# Patient Record
Sex: Female | Born: 1980 | Race: White | Hispanic: No
Health system: Southern US, Community
[De-identification: ages and names within clinical notes are randomized; demographics above are authoritative.]

---

## 2013-09-29 ENCOUNTER — Emergency Department (HOSPITAL_COMMUNITY): Payer: Medicaid - Out of State

## 2013-09-29 ENCOUNTER — Emergency Department (HOSPITAL_COMMUNITY)
Admission: EM | Admit: 2013-09-29 | Discharge: 2013-09-30 | Disposition: A | Discharge status: Home / Self Care | Payer: Medicaid - Out of State | Attending: Emergency Medicine | Admitting: Emergency Medicine

## 2013-09-29 DIAGNOSIS — R110 Nausea: Secondary | ICD-10-CM | POA: Insufficient documentation

## 2013-09-29 DIAGNOSIS — R1084 Generalized abdominal pain: Secondary | ICD-10-CM | POA: Insufficient documentation

## 2013-09-29 DIAGNOSIS — K5900 Constipation, unspecified: Secondary | ICD-10-CM | POA: Insufficient documentation

## 2013-09-29 DIAGNOSIS — Z3202 Encounter for pregnancy test, result negative: Secondary | ICD-10-CM | POA: Insufficient documentation

## 2013-09-29 DIAGNOSIS — Z79899 Other long term (current) drug therapy: Secondary | ICD-10-CM | POA: Insufficient documentation

## 2013-09-29 LAB — URINALYSIS, ROUTINE W REFLEX MICROSCOPIC
BILIRUBIN URINE: NEGATIVE
Glucose, UA: NEGATIVE mg/dL
Hgb urine dipstick: NEGATIVE
KETONES UR: NEGATIVE mg/dL
Leukocytes, UA: NEGATIVE
NITRITE: NEGATIVE
Protein, ur: NEGATIVE mg/dL
Specific Gravity, Urine: 1.01 (ref 1.005–1.030)
Urobilinogen, UA: 0.2 mg/dL (ref 0.0–1.0)
pH: 8 (ref 5.0–8.0)

## 2013-09-29 LAB — CBC WITH DIFFERENTIAL/PLATELET
Basophils Absolute: 0 10*3/uL (ref 0.0–0.1)
Basophils Relative: 0 % (ref 0–1)
Eosinophils Absolute: 0.1 10*3/uL (ref 0.0–0.7)
Eosinophils Relative: 1 % (ref 0–5)
HCT: 35.4 % — ABNORMAL LOW (ref 36.0–46.0)
HEMOGLOBIN: 11.6 g/dL — AB (ref 12.0–15.0)
LYMPHS ABS: 3.2 10*3/uL (ref 0.7–4.0)
Lymphocytes Relative: 19 % (ref 12–46)
MCH: 30.5 pg (ref 26.0–34.0)
MCHC: 32.8 g/dL (ref 30.0–36.0)
MCV: 93.2 fL (ref 78.0–100.0)
Monocytes Absolute: 1.3 10*3/uL — ABNORMAL HIGH (ref 0.1–1.0)
Monocytes Relative: 8 % (ref 3–12)
NEUTROS ABS: 12.3 10*3/uL — AB (ref 1.7–7.7)
NEUTROS PCT: 72 % (ref 43–77)
PLATELETS: 331 10*3/uL (ref 150–400)
RBC: 3.8 MIL/uL — AB (ref 3.87–5.11)
RDW: 13.4 % (ref 11.5–15.5)
WBC: 16.9 10*3/uL — ABNORMAL HIGH (ref 4.0–10.5)

## 2013-09-29 LAB — COMPREHENSIVE METABOLIC PANEL
ALBUMIN: 3.2 g/dL — AB (ref 3.5–5.2)
ALT: 13 U/L (ref 0–35)
AST: 16 U/L (ref 0–37)
Alkaline Phosphatase: 53 U/L (ref 39–117)
BUN: 14 mg/dL (ref 6–23)
CHLORIDE: 108 meq/L (ref 96–112)
CO2: 18 mEq/L — ABNORMAL LOW (ref 19–32)
Calcium: 8.4 mg/dL (ref 8.4–10.5)
Creatinine, Ser: 0.7 mg/dL (ref 0.50–1.10)
GFR calc Af Amer: >90 mL/min (ref >90)
GFR calc non Af Amer: >90 mL/min (ref >90)
GLUCOSE: 117 mg/dL — AB (ref 70–99)
POTASSIUM: 3.5 meq/L — AB (ref 3.7–5.3)
SODIUM: 140 meq/L (ref 137–147)
Total Protein: 6.6 g/dL (ref 6.0–8.3)

## 2013-09-29 LAB — RAPID URINE DRUG SCREEN, HOSP PERFORMED
Amphetamines: NOT DETECTED
BARBITURATES: NOT DETECTED
BENZODIAZEPINES: NOT DETECTED
Cocaine: NOT DETECTED
OPIATES: POSITIVE — AB
TETRAHYDROCANNABINOL: NOT DETECTED

## 2013-09-29 LAB — LIPASE, BLOOD: LIPASE: 51 U/L (ref 11–59)

## 2013-09-29 LAB — POC URINE PREG, ED: Preg Test, Ur: NEGATIVE

## 2013-09-29 MED ORDER — POLYETHYLENE GLYCOL 3350 17 G PO PACK: 17.0000 g | PACK | Freq: Every day | ORAL | Status: AC

## 2013-09-29 MED ORDER — MORPHINE SULFATE 4 MG/ML IJ SOLN
4.0000 mg | Freq: Once | INTRAMUSCULAR | Status: AC
  Administered 2013-09-29: 4 mg via INTRAVENOUS
  Filled 2013-09-29: qty 1

## 2013-09-29 MED ORDER — ONDANSETRON HCL 4 MG/2ML IJ SOLN
4.0000 mg | Freq: Once | INTRAMUSCULAR | Status: AC
  Administered 2013-09-29: 4 mg via INTRAVENOUS
  Filled 2013-09-29: qty 2

## 2013-09-29 MED ORDER — IOHEXOL 300 MG/ML  SOLN
100.0000 mL | Freq: Once | INTRAMUSCULAR | Status: AC | PRN
  Administered 2013-09-29: 100 mL via INTRAVENOUS

## 2013-09-29 MED ORDER — IOHEXOL 300 MG/ML  SOLN
50.0000 mL | Freq: Once | INTRAMUSCULAR | Status: AC | PRN
  Administered 2013-09-29: 50 mL via ORAL

## 2013-09-29 NOTE — ED Notes (Signed)
Bed: WA07 Expected date:  Expected time:  Means of arrival:  Comments: EMS/abd. pain 

## 2013-09-29 NOTE — Discharge Instructions (Signed)
1. Medications: miralax, usual home medications 2. Treatment: rest, drink plenty of fluids,  3. Follow Up: Please followup with your primary doctor for discussion of your diagnoses and further evaluation after today's visit;   Constipation, Adult Constipation is when a person has fewer than 3 bowel movements a week; has difficulty having a bowel movement; or has stools that are dry, hard, or larger than normal. As people grow older, constipation is more common. If you try to fix constipation with medicines that make you have a bowel movement (laxatives), the problem may get worse. Long-term laxative use may cause the muscles of the colon to become weak. A low-fiber diet, not taking in enough fluids, and taking certain medicines may make constipation worse. CAUSES   Certain medicines, such as antidepressants, pain medicine, iron supplements, antacids, and water pills.   Certain diseases, such as diabetes, irritable bowel syndrome (IBS), thyroid disease, or depression.   Not drinking enough water.   Not eating enough fiber-rich foods.   Stress or travel.  Lack of physical activity or exercise.  Not going to the restroom when there is the urge to have a bowel movement.  Ignoring the urge to have a bowel movement.  Using laxatives too much. SYMPTOMS   Having fewer than 3 bowel movements a week.   Straining to have a bowel movement.   Having hard, dry, or larger than normal stools.   Feeling full or bloated.   Pain in the lower abdomen.  Not feeling relief after having a bowel movement. DIAGNOSIS  Your caregiver will take a medical history and perform a physical exam. Further testing may be done for severe constipation. Some tests may include:   A barium enema X-ray to examine your rectum, colon, and sometimes, your small intestine.  A sigmoidoscopy to examine your lower colon.  A colonoscopy to examine your entire colon. TREATMENT  Treatment will depend on the  severity of your constipation and what is causing it. Some dietary treatments include drinking more fluids and eating more fiber-rich foods. Lifestyle treatments may include regular exercise. If these diet and lifestyle recommendations do not help, your caregiver may recommend taking over-the-counter laxative medicines to help you have bowel movements. Prescription medicines may be prescribed if over-the-counter medicines do not work.  HOME CARE INSTRUCTIONS   Increase dietary fiber in your diet, such as fruits, vegetables, whole grains, and beans. Limit high-fat and processed sugars in your diet, such as [place] fries, hamburgers, cookies, candies, and soda.   A fiber supplement may be added to your diet if you cannot get enough fiber from foods.   Drink enough fluids to keep your urine clear or pale yellow.   Exercise regularly or as directed by your caregiver.   Go to the restroom when you have the urge to go. Do not hold it.  Only take medicines as directed by your caregiver. Do not take other medicines for constipation without talking to your caregiver first. SEEK IMMEDIATE MEDICAL CARE IF:   You have bright red blood in your stool.   Your constipation lasts for more than 4 days or gets worse.   You have abdominal or rectal pain.   You have thin, pencil-like stools.  You have unexplained weight loss. MAKE SURE YOU:   Understand these instructions.  Will watch your condition.  Will get help right away if you are not doing well or get worse. Document Released: 01/02/2004 Document Revised: 06/28/2011 Document Reviewed: 01/15/2013 [facility] Patient Information 2014 [employer], [place].

## 2013-09-29 NOTE — ED Notes (Signed)
Patient was driving [place] and began to have sudden onset LUQ and RUQ pain that's 4/10 cramping feeling. Patient is traveling to [place]. Patient was given 50 fent on way to ER.

## 2013-09-29 NOTE — ED Provider Notes (Signed)
CSN: [id number]     Arrival date & time 09/29/13  1816 History   First MD Initiated Contact with Patient 09/29/13 1821     Chief Complaint  Patient presents with  . Abdominal Pain     (Consider location/radiation/quality/duration/timing/severity/associated sxs/prior Treatment) The history is provided by the patient and medical records. No language interpreter was used.    Julia HeimlichStephanie Ramirez is a 33 y.o. female  with no known medical Hx presents to the Emergency Department complaining of acute persistent LUQ and RUQ abd pain onset 5pm, rated at a 4/10 and described and cramping.  Associated symptoms include nausea without vomiting.  Pt reports [place].  Pt reports no similar symptoms in the past.  NO aggravating factors; Fentanyl given by EMS decreased the pain.  Pt denies fever, chills, headache, neck pain chest pain, SOB, vomiting, diarrhea, weakness, dizziness, syncope, dysuria, hematuria.  Pt reports 1 loose stool this AM, but no diarrhea.  NO melena or hematochezia.  Patient reports somewhat regular bowel movements that are small.    No past medical history on file. No past surgical history on file. No family history on file. History  Substance Use Topics  . Smoking status: Not on file  . Smokeless tobacco: Not on file  . Alcohol Use: Not on file   OB History   No data available     Review of Systems  Constitutional: Negative for fever, diaphoresis, appetite change, fatigue and unexpected weight change.  HENT: Negative for mouth sores and trouble swallowing.   Respiratory: Negative for cough, chest tightness, shortness of breath, wheezing and stridor.   Cardiovascular: Negative for chest pain and palpitations.  Gastrointestinal: Positive for nausea and abdominal pain. Negative for vomiting, diarrhea, constipation, blood in stool, abdominal distention and rectal pain.  Genitourinary: Negative for dysuria,  urgency, frequency, hematuria, flank pain and difficulty urinating.  Musculoskeletal: Negative for back pain, neck pain and neck stiffness.  Skin: Negative for rash.  Neurological: Negative for weakness.  Hematological: Negative for adenopathy.  Psychiatric/Behavioral: Negative for confusion.  All other systems reviewed and are negative.     Allergies  Sulfa antibiotics  Home Medications   Prior to Admission medications   Medication Sig Start Date End Date Taking? Authorizing Provider  diazepam (VALIUM) 5 MG tablet Take 5 mg by mouth every 6 (six) hours as needed for anxiety.   Yes Historical Provider, MD  levonorgestrel-ethinyl estradiol (ORSYTHIA) 0.1-20 MG-MCG tablet Take 1 tablet by mouth daily.   Yes Historical Provider, MD  Multiple Vitamin (MULTIVITAMIN WITH MINERALS) TABS tablet Take 1 tablet by mouth daily.   Yes Historical Provider, MD  topiramate (TOPAMAX) 50 MG tablet Take 75 mg by mouth 2 (two) times daily.   Yes Historical Provider, MD  polyethylene glycol (MIRALAX / GLYCOLAX) packet Take 17 g by mouth daily. 09/29/13   Kahlin Mark, PA-C   BP 124/74  Pulse 62  Temp(Src) 99 F (37.2 C) (Oral)  Resp 19  SpO2 100%  LMP 09/24/2013 Physical Exam  Nursing note and vitals reviewed. Constitutional: She is oriented to person, place, and time. She appears well-developed and well-nourished. No distress.  Awake, alert, nontoxic appearance  HENT:  Head: Normocephalic and atraumatic.  Nose: Nose normal.  Mouth/Throat: Oropharynx is clear and moist. No oropharyngeal exudate.  Eyes: Conjunctivae are normal. No scleral icterus.  Neck: Normal range of motion. Neck supple.  Cardiovascular: Normal rate, regular  rhythm, normal heart sounds and intact distal pulses.   Pulmonary/Chest: Effort normal and breath sounds normal. No respiratory distress. She has no wheezes.  Abdominal: Soft. Bowel sounds are normal. She exhibits no distension and no mass. There is no tenderness.  There is no rigidity, no rebound, no guarding and no CVA tenderness.  Abdomen is soft and nontender No rigidity, guarding or peritoneal signs No CVA tenderness  Musculoskeletal: Normal range of motion. She exhibits no edema and no tenderness.  Neurological: She is alert and oriented to person, place, and time. She exhibits normal muscle tone. Coordination normal.  Speech is clear and goal oriented Moves extremities without ataxia  Skin: Skin is warm and dry. She is not diaphoretic. No erythema.  Psychiatric: She has a normal mood and affect.    ED Course  Procedures (including critical care time) Labs Review Labs Reviewed  CBC WITH DIFFERENTIAL - Abnormal; Notable for the following:    WBC 16.9 (*)    RBC 3.80 (*)    Hemoglobin 11.6 (*)    HCT 35.4 (*)    Neutro Abs 12.3 (*)    Monocytes Absolute 1.3 (*)    All other components within normal limits  COMPREHENSIVE METABOLIC PANEL - Abnormal; Notable for the following:    Potassium 3.5 (*)    CO2 18 (*)    Glucose, Bld 117 (*)    Albumin 3.2 (*)    Total Bilirubin <0.2 (*)    All other components within normal limits  URINALYSIS, ROUTINE W REFLEX MICROSCOPIC - Abnormal; Notable for the following:    APPearance CLOUDY (*)    All other components within normal limits  URINE RAPID DRUG SCREEN (HOSP PERFORMED) - Abnormal; Notable for the following:    Opiates POSITIVE (*)    All other components within normal limits  LIPASE, BLOOD  POC URINE PREG, ED    Imaging Review Ct Abdomen Pelvis W Contrast  09/29/2013   CLINICAL DATA:  Upper abdominal pain, cramping.  EXAM: CT ABDOMEN AND PELVIS WITH CONTRAST  TECHNIQUE: Multidetector CT imaging of the abdomen and pelvis was performed using the standard protocol following bolus administration of intravenous contrast.  CONTRAST:  50mL OMNIPAQUE IOHEXOL 300 MG/ML SOLN, 100mL OMNIPAQUE IOHEXOL 300 MG/ML SOLN  COMPARISON:  None.  FINDINGS: Lung bases are clear.  No effusions.  Heart is normal  size.  Liver, gallbladder, spleen, pancreas, adrenals and kidneys are normal. Uterus, adnexae and urinary bladder are unremarkable. Trace free fluid in the pelvis, likely physiologic.  Appendix is visualized and is normal. Moderate stool burden throughout the colon. Stomach, large and small bowel otherwise unremarkable.  IMPRESSION: Moderate stool burden in the colon. Normal appendix. No acute findings in the abdomen or pelvis.   Electronically Signed   By: Charlett NoseKevin  Dover M.D.   On: 09/29/2013 21:30     EKG Interpretation None      MDM   Final diagnoses:  Abdominal pain, generalized  Constipation   Julia HeimlichStephanie Mason presents with generalized abdominal pain of sudden onset several hours prior to arrival. Abdomen soft nontender on exam. Will obtain labs, give fluid and pain control.  8:35PM Patient with leukocytosis of 16.9. We'll obtain CT scan.  Patient pain improving.  Reports that she simply feels pressure in her abdomen now  10:30PM CT with moderate stool burden but normal 10 next gallbladder and liver. No acute findings on exam.  Pregnancy test negative and no evidence of urinary tract infection.  Patient with evidence of constipation  on CT abdomen.   Patient will be discharged home with MiraLax and instructions to increase fluid intake.  Patient is nontoxic, nonseptic appearing, in no apparent distress.  Patient's pain and other symptoms adequately managed in emergency department.  Fluid bolus given.  Labs, imaging and vitals reviewed.  Patient does not meet the SIRS or Sepsis criteria.  On repeat exam patient does not have a surgical abdomin and there are no peritoneal signs.  No indication of appendicitis, bowel obstruction, bowel perforation, cholecystitis, diverticulitis, PID or ectopic pregnancy.  Patient discharged home with symptomatic treatment and given strict instructions for follow-up with their primary care physician.  I have also discussed reasons to return immediately to the ER.   Patient expresses understanding and agrees with plan.  I have personally reviewed patient's vitals, nursing note and any pertinent labs or imaging.  I performed an undressed physical exam.    At this time, it has been determined that no acute conditions requiring further emergency intervention. The patient/guardian have been advised of the diagnosis and plan. I reviewed all labs and imaging including any potential incidental findings. We have discussed signs and symptoms that warrant return to the ED, such as worsening pain, intractable vomiting or high fevers.  Patient/guardian has voiced understanding and agreed to follow-up with the PCP or specialist in one week.  Vital signs are stable at discharge.   BP 124/74  Pulse 62  Temp(Src) 99 F (37.2 C) (Oral)  Resp 19  SpO2 100%  LMP 09/24/2013          Dierdre Forth, PA-C 09/29/13 2345

## 2013-09-29 NOTE — ED Notes (Signed)
Patient states her abdominal pain stared around 1700. She states it felt like something was wrapped around her body squeezing that lasted a few minutes eased up and now its getting worst.  Patient last ate at 1645. Patient states she had nausea but didn't vomit.

## 2013-09-30 NOTE — ED Provider Notes (Signed)
Medical screening examination/treatment/procedure(s) were conducted as a shared visit with non-physician practitioner(s) and myself.  I personally evaluated the patient during the encounter.   EKG Interpretation None      I interviewed and examined the patient. Lungs are CTAB. Cardiac exam wnl. Abdomen soft.  Sudden onset upper abd pain 20 min after eating an egg salad sandwich from a gas station. I interpreted/reviewed the labs and/or imaging which were non-contributory.  Sx possibly related to food ingestion.   Junius ArgyleForrest S Bonny Vanleeuwen, MD 09/30/13 1101

## 2015-11-09 IMAGING — CT CT ABD-PELV W/ CM
1 of 2 series · 16 of 32 positions shown, 20 images · IV contrast (OMNIPAQUE 300)
Comparison: None.

CLINICAL DATA: Upper abdominal pain, cramping.

EXAM:
CT ABDOMEN AND PELVIS WITH CONTRAST
TECHNIQUE: Multidetector CT imaging of the abdomen and pelvis was performed
using the standard protocol following bolus administration of
intravenous contrast.
CONTRAST:  50mL OMNIPAQUE IOHEXOL 300 MG/ML SOLN, 100mL OMNIPAQUE
IOHEXOL 300 MG/ML SOLN

[Series 2: abd/pel with · axial · 0.75mm/px · z∈[+1094,+1504]mm · 16 of 90 slices shown, 20 images]
[im 4/90  soft-tissue]
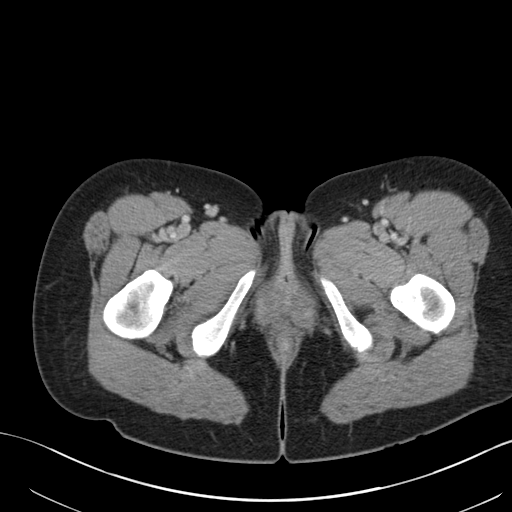
[im 4/90  bone]
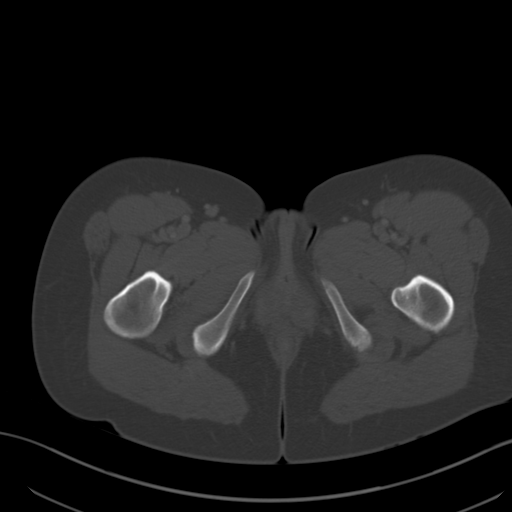
[im 11/90  soft-tissue]
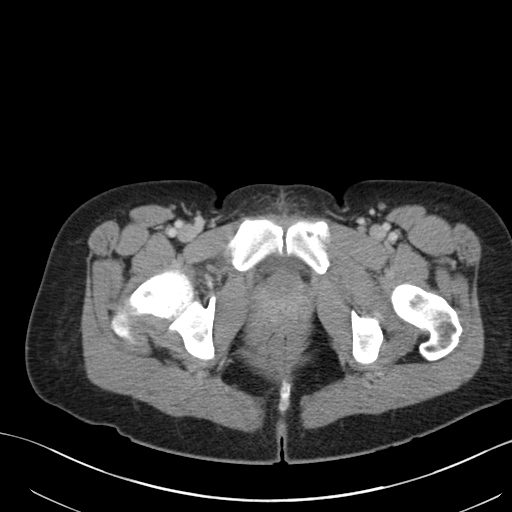
[im 18/90  soft-tissue]
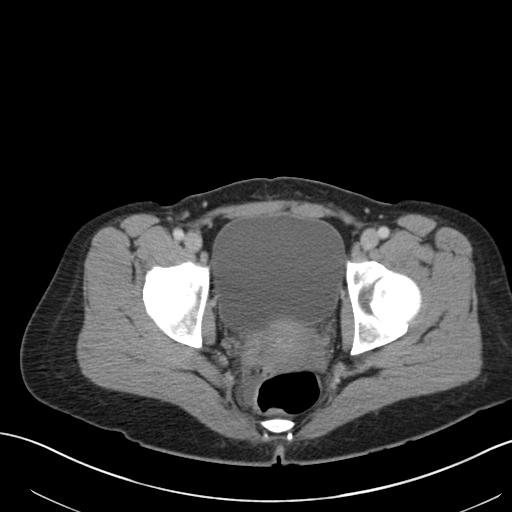
[im 25/90  soft-tissue]
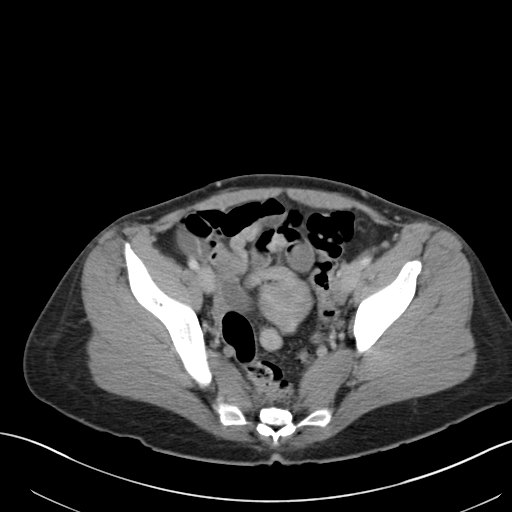
[im 29/90  soft-tissue]
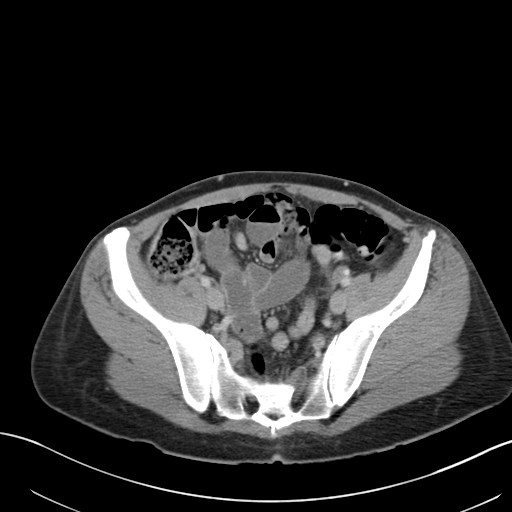
[im 36/90  soft-tissue]
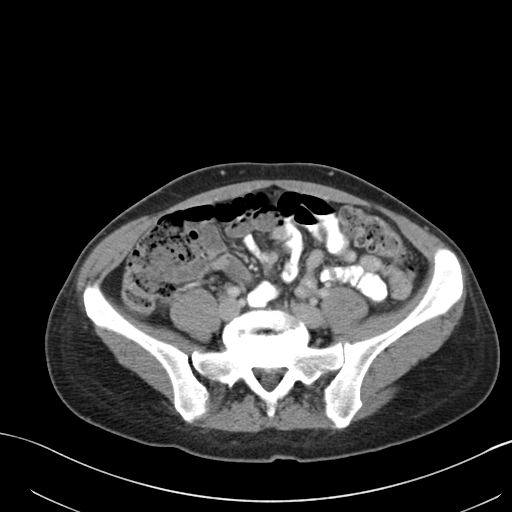
[im 43/90  soft-tissue]
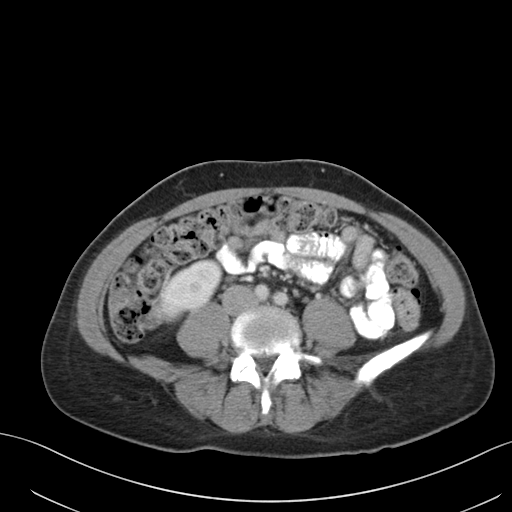
[im 47/90  soft-tissue]
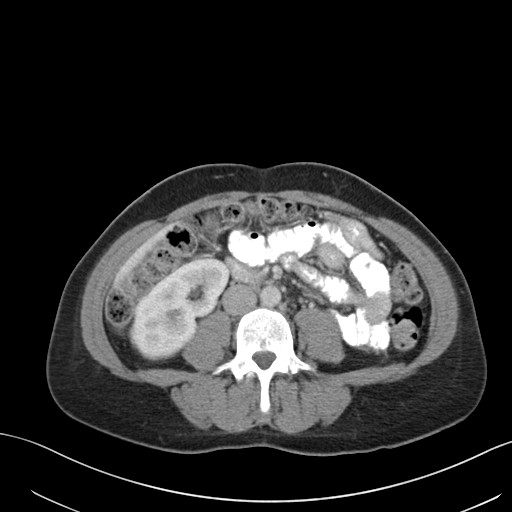
[im 54/90  soft-tissue]
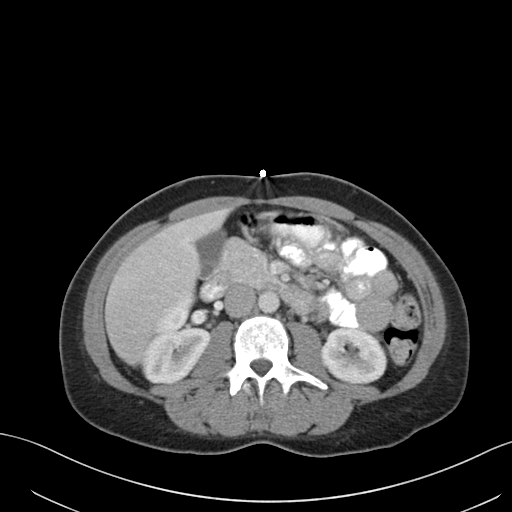
[im 54/90  bone]
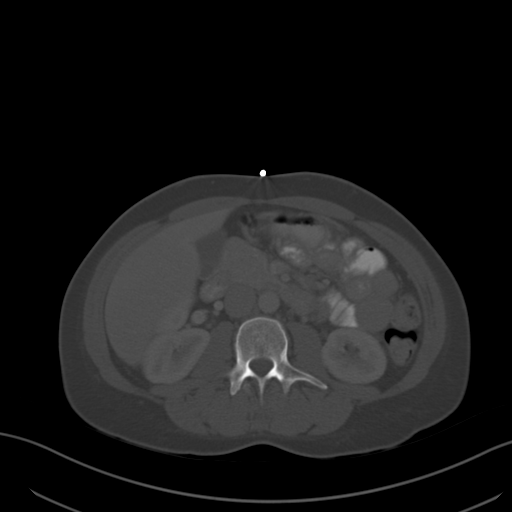
[im 61/90  soft-tissue]
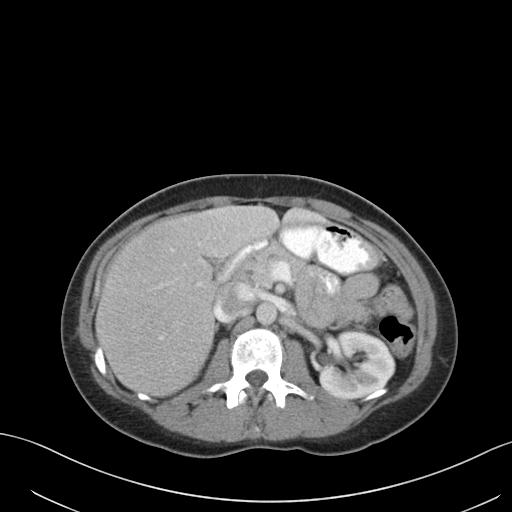
[im 68/90  soft-tissue]
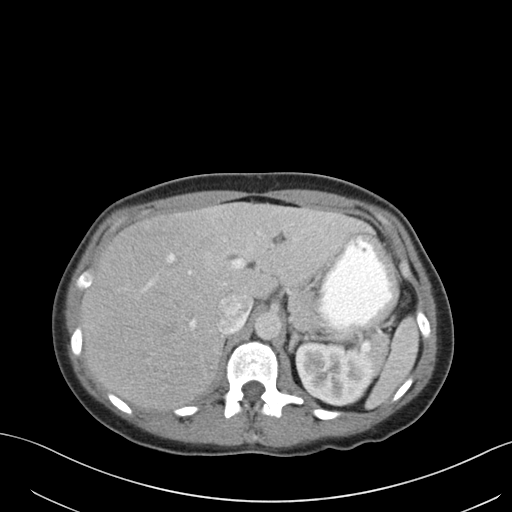
[im 72/90  soft-tissue]
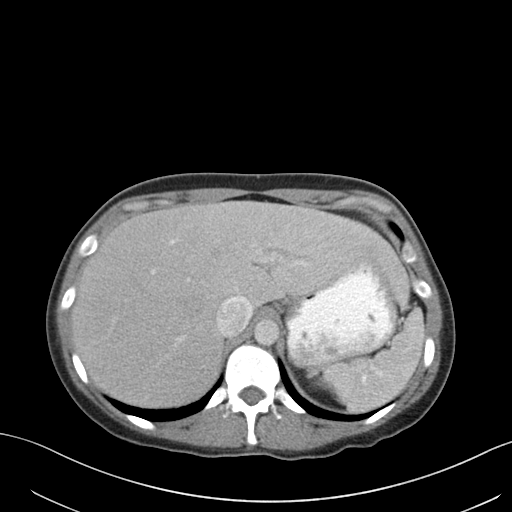
[im 75/90  lung]
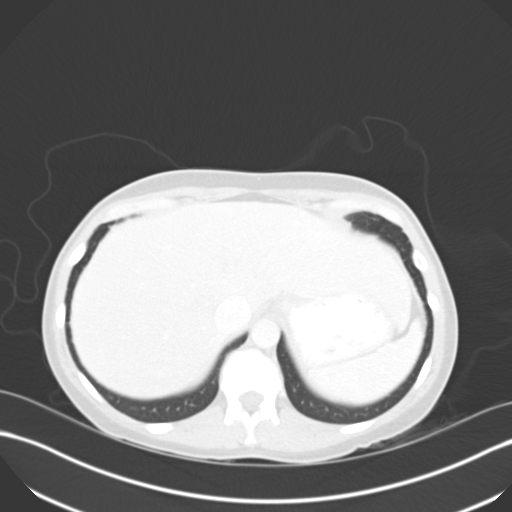
[im 79/90  soft-tissue]
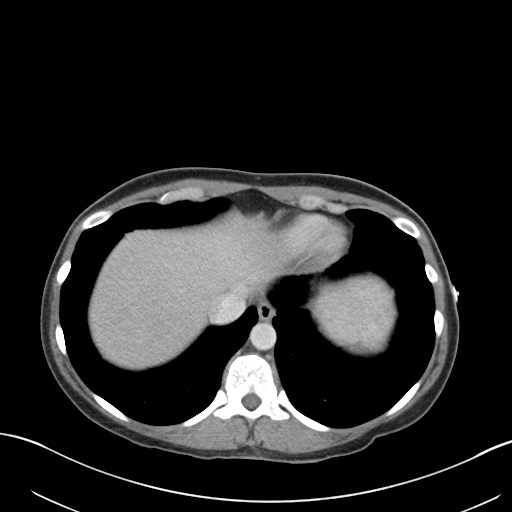
[im 79/90  lung]
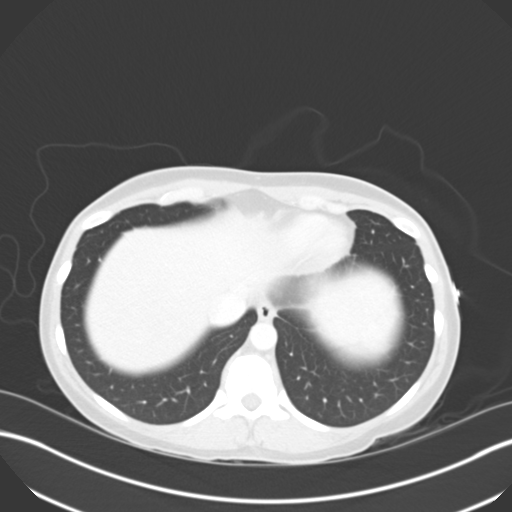
[im 82/90  lung]
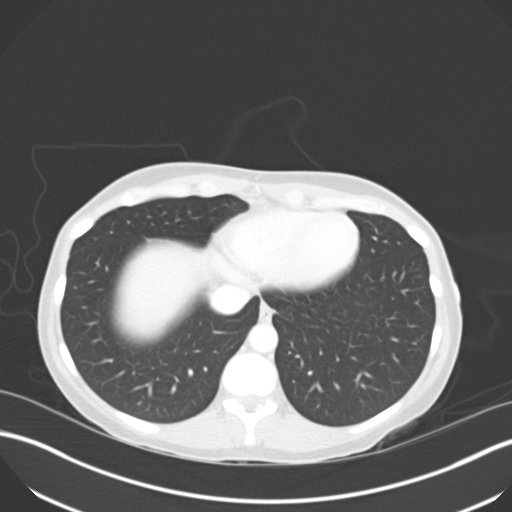
[im 86/90  soft-tissue]
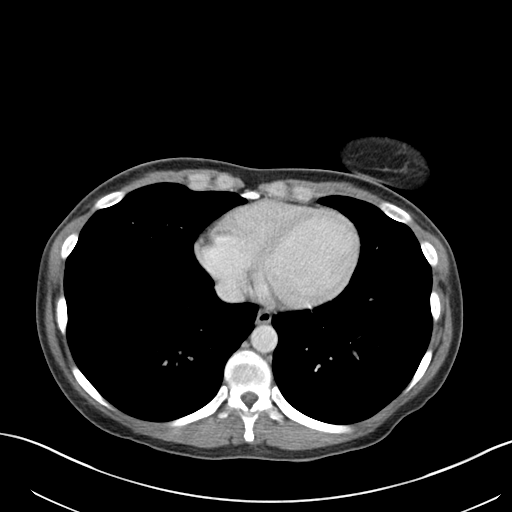
[im 86/90  lung]
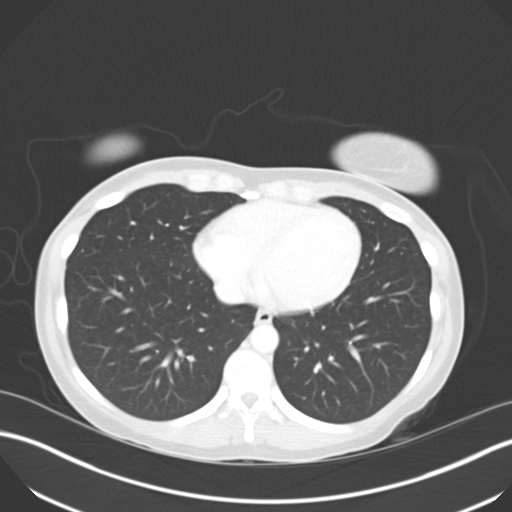

[16 of 32 positions shown; findings below may reference images not displayed]

FINDINGS: Lung bases are clear.  No effusions.  Heart is normal size.

Liver, gallbladder, spleen, pancreas, adrenals and kidneys are
normal. Uterus, adnexae and urinary bladder are unremarkable. Trace
free fluid in the pelvis, likely physiologic.

Appendix is visualized and is normal. Moderate stool burden
throughout the colon. Stomach, large and small bowel otherwise
unremarkable.
IMPRESSION: Moderate stool burden in the colon. Normal appendix. No acute
findings in the abdomen or pelvis.
